# Patient Record
Sex: Female | Born: 1970 | Race: Black or African American | Hispanic: No | Marital: Married
Health system: Southern US, Community
[De-identification: ages and names within clinical notes are randomized; demographics above are authoritative.]

## PROBLEM LIST (undated history)

## (undated) HISTORY — PX: ABDOMINAL HYSTERECTOMY: SHX81

---

## 2019-03-24 ENCOUNTER — Other Ambulatory Visit: Payer: Self-pay | Admitting: Family Medicine

## 2019-03-24 DIAGNOSIS — Z1231 Encounter for screening mammogram for malignant neoplasm of breast: Secondary | ICD-10-CM

## 2019-03-31 ENCOUNTER — Other Ambulatory Visit: Payer: Self-pay

## 2019-03-31 ENCOUNTER — Ambulatory Visit
Admission: RE | Admit: 2019-03-31 | Discharge: 2019-03-31 | Disposition: A | Discharge status: Home / Self Care | Payer: BC Managed Care – PPO | Source: Ambulatory Visit | Attending: Family Medicine | Admitting: Family Medicine

## 2019-03-31 ENCOUNTER — Encounter (INDEPENDENT_AMBULATORY_CARE_PROVIDER_SITE_OTHER): Payer: Self-pay

## 2019-03-31 DIAGNOSIS — Z1231 Encounter for screening mammogram for malignant neoplasm of breast: Secondary | ICD-10-CM | POA: Diagnosis not present

## 2019-04-28 ENCOUNTER — Other Ambulatory Visit: Payer: Self-pay | Admitting: Medical Oncology

## 2019-04-28 ENCOUNTER — Other Ambulatory Visit: Payer: Self-pay

## 2019-04-28 ENCOUNTER — Ambulatory Visit
Admission: RE | Admit: 2019-04-28 | Discharge: 2019-04-28 | Disposition: A | Discharge status: Home / Self Care | Payer: BC Managed Care – PPO | Source: Ambulatory Visit | Attending: Medical Oncology | Admitting: Medical Oncology

## 2019-04-28 DIAGNOSIS — M25561 Pain in right knee: Secondary | ICD-10-CM

## 2019-04-28 DIAGNOSIS — M79661 Pain in right lower leg: Secondary | ICD-10-CM

## 2019-04-28 DIAGNOSIS — M79662 Pain in left lower leg: Secondary | ICD-10-CM | POA: Insufficient documentation

## 2019-04-28 DIAGNOSIS — M25562 Pain in left knee: Secondary | ICD-10-CM | POA: Diagnosis present

## 2019-08-27 ENCOUNTER — Ambulatory Visit: Payer: BC Managed Care – PPO | Attending: Internal Medicine

## 2019-08-27 ENCOUNTER — Other Ambulatory Visit: Payer: Self-pay

## 2019-08-27 DIAGNOSIS — Z23 Encounter for immunization: Secondary | ICD-10-CM

## 2019-08-27 NOTE — Progress Notes (Signed)
   Covid-19 Vaccination Clinic  Name:  Brittney Pearson    MRN: [id number] DOB: 15-Sep-1970  08/27/2019  [name] was observed post Covid-19 immunization for 15 minutes without incident. She was provided with Vaccine Information Sheet and instruction to access the V-Safe system.   [name] was instructed to call 911 with any severe reactions post vaccine: Marland Kitchen Difficulty breathing  . Swelling of face and throat  . A fast heartbeat  . A bad rash all over body  . Dizziness and weakness   Immunizations Administered    Name Date Dose VIS Date Route   Pfizer COVID-19 Vaccine 08/27/2019  8:19 AM 0.3 mL 05/15/2019 Intramuscular   Manufacturer: [employer], [employer]   Lot: [id number]   NDC: [address]-3

## 2019-09-23 ENCOUNTER — Ambulatory Visit: Payer: BC Managed Care – PPO | Attending: Internal Medicine

## 2019-09-23 DIAGNOSIS — Z23 Encounter for immunization: Secondary | ICD-10-CM

## 2019-09-23 NOTE — Progress Notes (Signed)
   Covid-19 Vaccination Clinic  Name:  [name]    MRN: [id number] DOB: [birth year]  09/23/2019  [name] was observed post Covid-19 immunization for 15 minutes without incident. She was provided with Vaccine Information Sheet and instruction to access the V-Safe system.   [name] was instructed to call 911 with any severe reactions post vaccine: Marland Kitchen Difficulty breathing  . Swelling of face and throat  . A fast heartbeat  . A bad rash all over body  . Dizziness and weakness   Immunizations Administered    Name Date Dose VIS Date Route   Pfizer COVID-19 Vaccine 09/23/2019  8:18 AM 0.3 mL 07/29/2018 Intramuscular   Manufacturer: [employer], [employer]   Lot: [id number]   NDC: [address]-9

## 2020-04-14 ENCOUNTER — Other Ambulatory Visit: Payer: Self-pay | Admitting: Family Medicine

## 2020-04-14 DIAGNOSIS — Z1231 Encounter for screening mammogram for malignant neoplasm of breast: Secondary | ICD-10-CM

## 2020-04-26 ENCOUNTER — Other Ambulatory Visit: Payer: Self-pay

## 2020-04-26 ENCOUNTER — Ambulatory Visit
Admission: RE | Admit: 2020-04-26 | Discharge: 2020-04-26 | Disposition: A | Discharge status: Home / Self Care | Payer: BC Managed Care – PPO | Source: Ambulatory Visit | Attending: Family Medicine | Admitting: Family Medicine

## 2020-04-26 DIAGNOSIS — Z1231 Encounter for screening mammogram for malignant neoplasm of breast: Secondary | ICD-10-CM | POA: Diagnosis present

## 2021-02-22 LAB — EXTERNAL GENERIC LAB PROCEDURE: COLOGUARD: NEGATIVE

## 2021-02-22 LAB — COLOGUARD: COLOGUARD: NEGATIVE

## 2021-05-01 ENCOUNTER — Other Ambulatory Visit: Payer: Self-pay | Admitting: Family Medicine

## 2021-05-01 DIAGNOSIS — Z1231 Encounter for screening mammogram for malignant neoplasm of breast: Secondary | ICD-10-CM

## 2021-05-18 ENCOUNTER — Other Ambulatory Visit: Payer: Self-pay

## 2021-05-18 ENCOUNTER — Ambulatory Visit
Admission: RE | Admit: 2021-05-18 | Discharge: 2021-05-18 | Disposition: A | Discharge status: Home / Self Care | Payer: BC Managed Care – PPO | Source: Ambulatory Visit | Attending: Family Medicine | Admitting: Family Medicine

## 2021-05-18 DIAGNOSIS — Z1231 Encounter for screening mammogram for malignant neoplasm of breast: Secondary | ICD-10-CM

## 2022-09-06 ENCOUNTER — Other Ambulatory Visit: Payer: Self-pay | Admitting: Family Medicine

## 2022-09-06 DIAGNOSIS — Z1231 Encounter for screening mammogram for malignant neoplasm of breast: Secondary | ICD-10-CM

## 2022-10-05 ENCOUNTER — Ambulatory Visit
Admission: RE | Admit: 2022-10-05 | Discharge: 2022-10-05 | Disposition: A | Discharge status: Home / Self Care | Payer: BC Managed Care – PPO | Source: Ambulatory Visit | Attending: Family Medicine | Admitting: Family Medicine

## 2022-10-05 DIAGNOSIS — Z1231 Encounter for screening mammogram for malignant neoplasm of breast: Secondary | ICD-10-CM | POA: Insufficient documentation

## 2023-01-31 IMAGING — MG MM DIGITAL SCREENING BILAT W/ TOMO AND CAD
8 series · 8 of 24 positions shown · non-contrast
Comparison: Previous exam(s).

CLINICAL DATA: Screening.

EXAM:
DIGITAL SCREENING BILATERAL MAMMOGRAM WITH TOMOSYNTHESIS AND CAD
TECHNIQUE: Bilateral screening digital craniocaudal and mediolateral oblique
mammograms were obtained. Bilateral screening digital breast
tomosynthesis was performed. The images were evaluated with
computer-aided detection.

[R CC synth-2D]
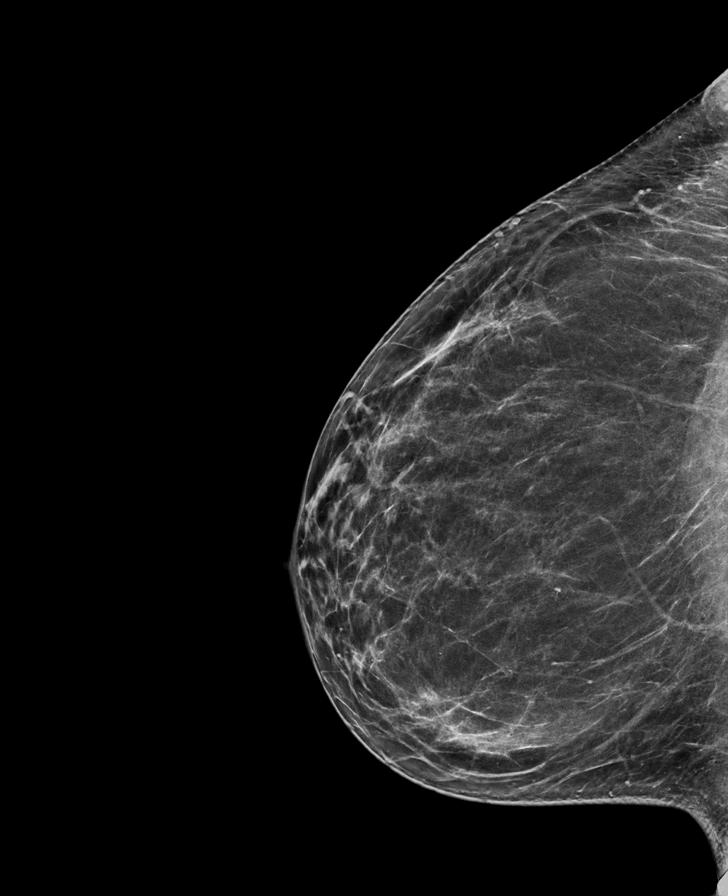

[L MLO synth-2D]
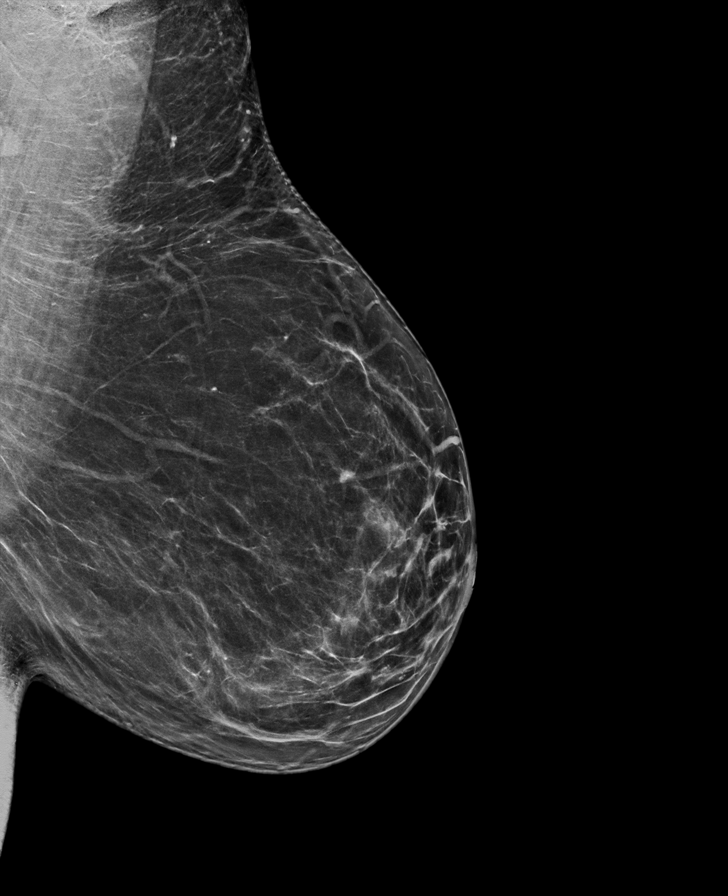

[L CC synth-2D]
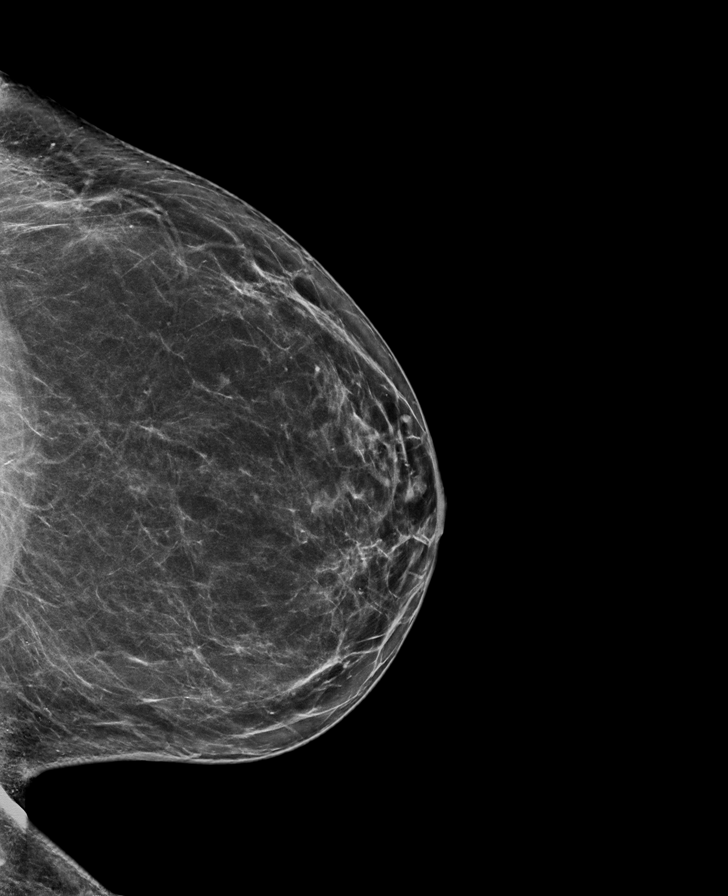

[R MLO synth-2D]
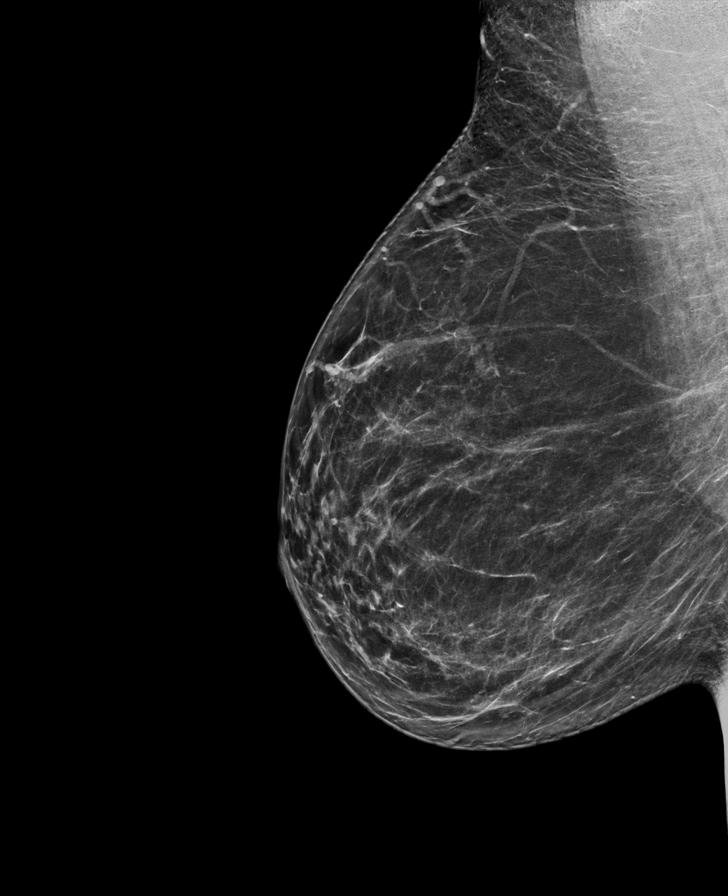

[R CC tomo · tomo slice 36/71.0]
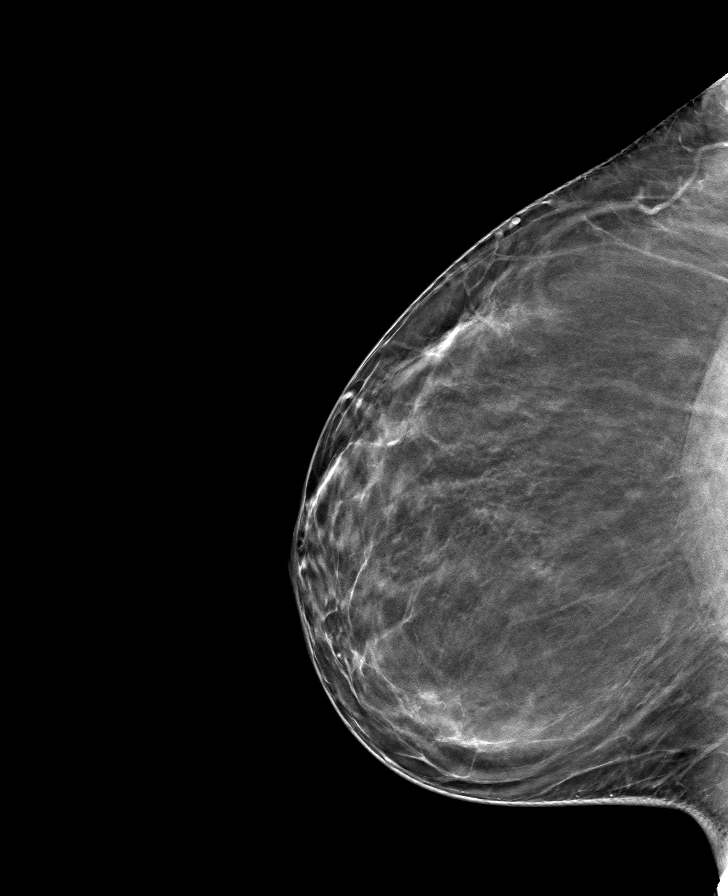

[L MLO tomo · tomo slice 39/77.0]
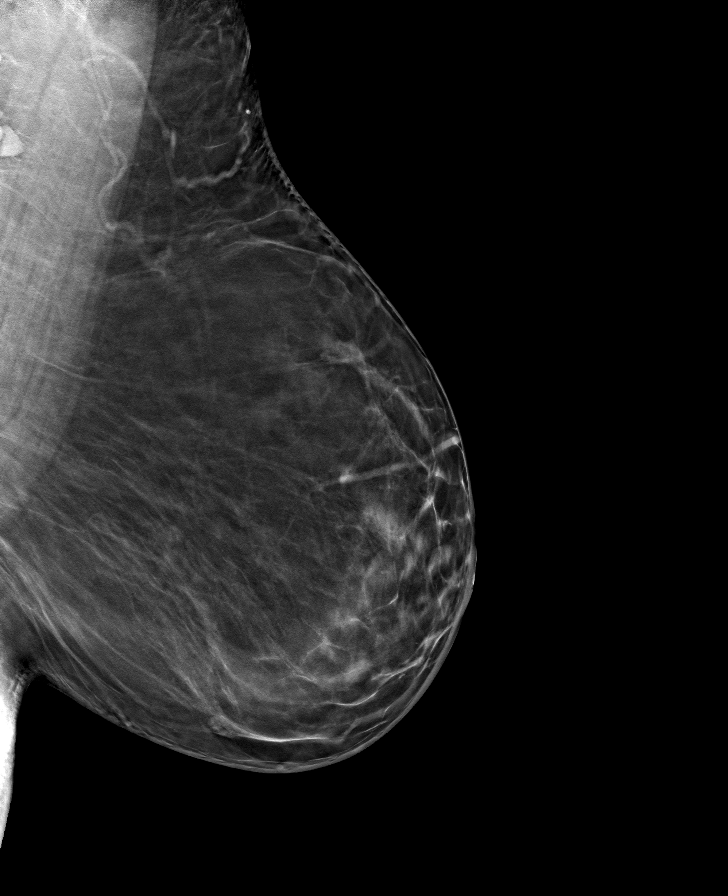

[L CC tomo · tomo slice 38/75.0]
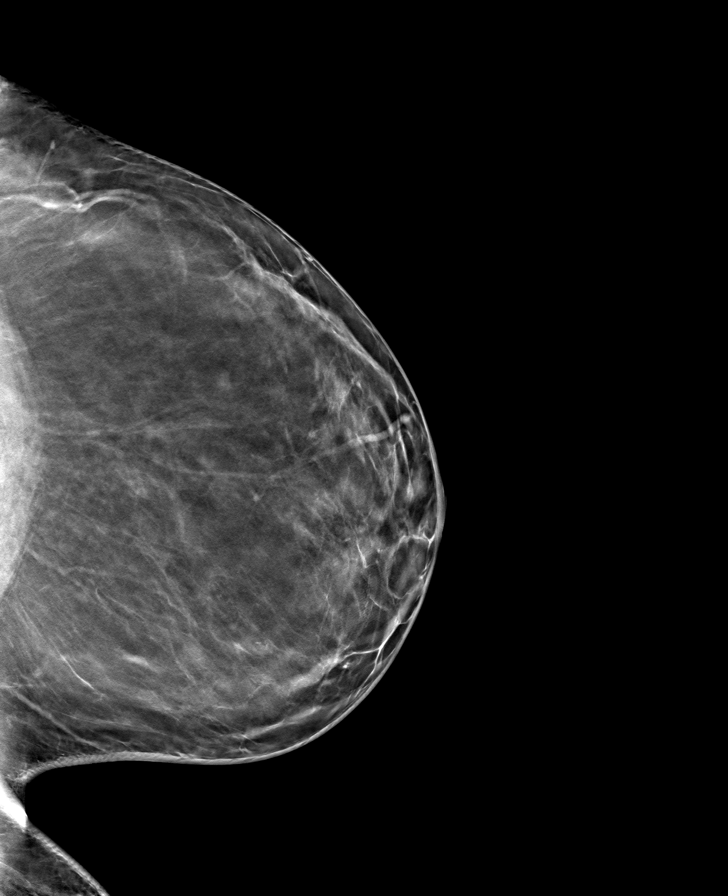

[R MLO tomo · tomo slice 38/75.0]
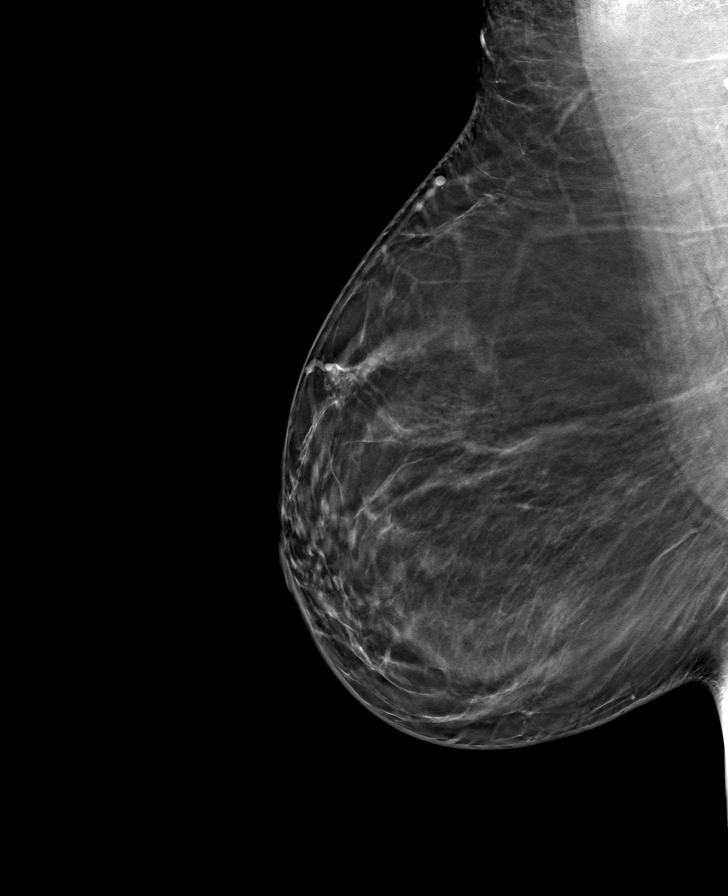

[8 of 24 positions shown; findings below may reference images not displayed]

ACR Breast Density Category b: There are scattered areas of
fibroglandular density.
FINDINGS: There are no findings suspicious for malignancy.
IMPRESSION: No mammographic evidence of malignancy. A result letter of this
screening mammogram will be mailed directly to the patient.

RECOMMENDATION:
Screening mammogram in one year. (Code:51-O-LD2)

BI-RADS CATEGORY  1: Negative.

## 2023-09-02 ENCOUNTER — Other Ambulatory Visit: Payer: Self-pay | Admitting: Family Medicine

## 2023-09-02 ENCOUNTER — Encounter: Payer: Self-pay | Admitting: Family Medicine

## 2023-09-02 DIAGNOSIS — Z1231 Encounter for screening mammogram for malignant neoplasm of breast: Secondary | ICD-10-CM

## 2023-10-11 ENCOUNTER — Ambulatory Visit
Admission: RE | Admit: 2023-10-11 | Discharge: 2023-10-11 | Disposition: A | Discharge status: Home / Self Care | Source: Ambulatory Visit | Attending: Family Medicine | Admitting: Family Medicine

## 2023-10-11 DIAGNOSIS — Z1231 Encounter for screening mammogram for malignant neoplasm of breast: Secondary | ICD-10-CM | POA: Insufficient documentation

## 2024-05-08 ENCOUNTER — Ambulatory Visit

## 2024-05-08 DIAGNOSIS — Z83719 Family history of colon polyps, unspecified: Secondary | ICD-10-CM | POA: Diagnosis not present

## 2024-05-08 DIAGNOSIS — K640 First degree hemorrhoids: Secondary | ICD-10-CM | POA: Diagnosis not present

## 2024-05-08 DIAGNOSIS — Z1211 Encounter for screening for malignant neoplasm of colon: Secondary | ICD-10-CM | POA: Diagnosis present
